# Patient Record
Sex: Male | Born: 1980 | Race: White | Hispanic: No | Marital: Married
Health system: Southern US, Community
[De-identification: ages and names within clinical notes are randomized; demographics above are authoritative.]

---

## 2007-04-29 ENCOUNTER — Encounter: Admission: RE | Admit: 2007-04-29 | Discharge: 2007-04-29 | Payer: Self-pay | Admitting: Internal Medicine

## 2013-09-09 ENCOUNTER — Ambulatory Visit
Admission: RE | Admit: 2013-09-09 | Discharge: 2013-09-09 | Disposition: A | Discharge status: Home / Self Care | Payer: Worker's Compensation | Source: Ambulatory Visit | Attending: Family | Admitting: Family

## 2013-09-09 ENCOUNTER — Other Ambulatory Visit: Payer: Self-pay | Admitting: Family

## 2013-09-09 DIAGNOSIS — S40021A Contusion of right upper arm, initial encounter: Secondary | ICD-10-CM

## 2015-03-16 IMAGING — CR DG ELBOW COMPLETE 3+V*R*
4 series · 4 of 4 positions shown · non-contrast
Comparison: None.

CLINICAL DATA: Trauma.  Posterior pain.

EXAM:
RIGHT ELBOW - COMPLETE 3+ VIEW

[view not recorded (1 of 4)]
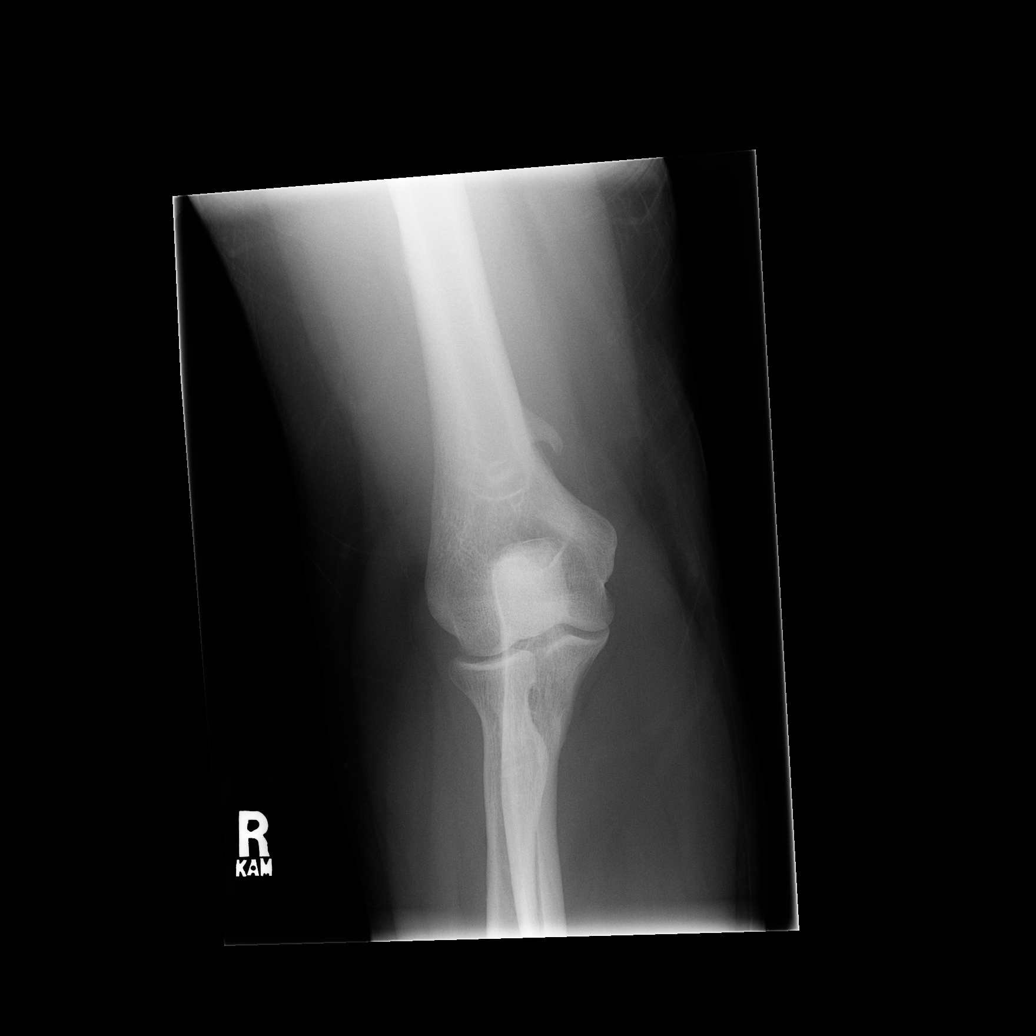

[view not recorded (2 of 4)]
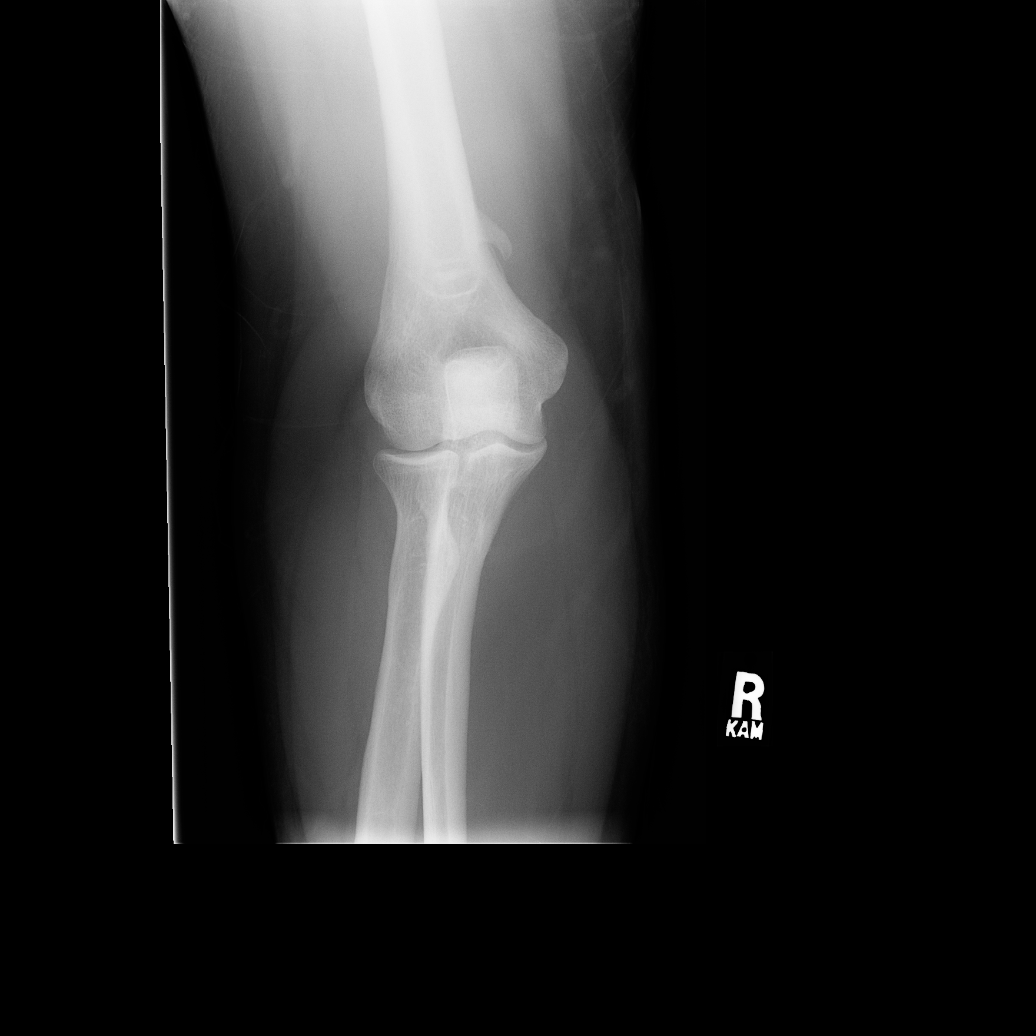

[view not recorded (3 of 4)]
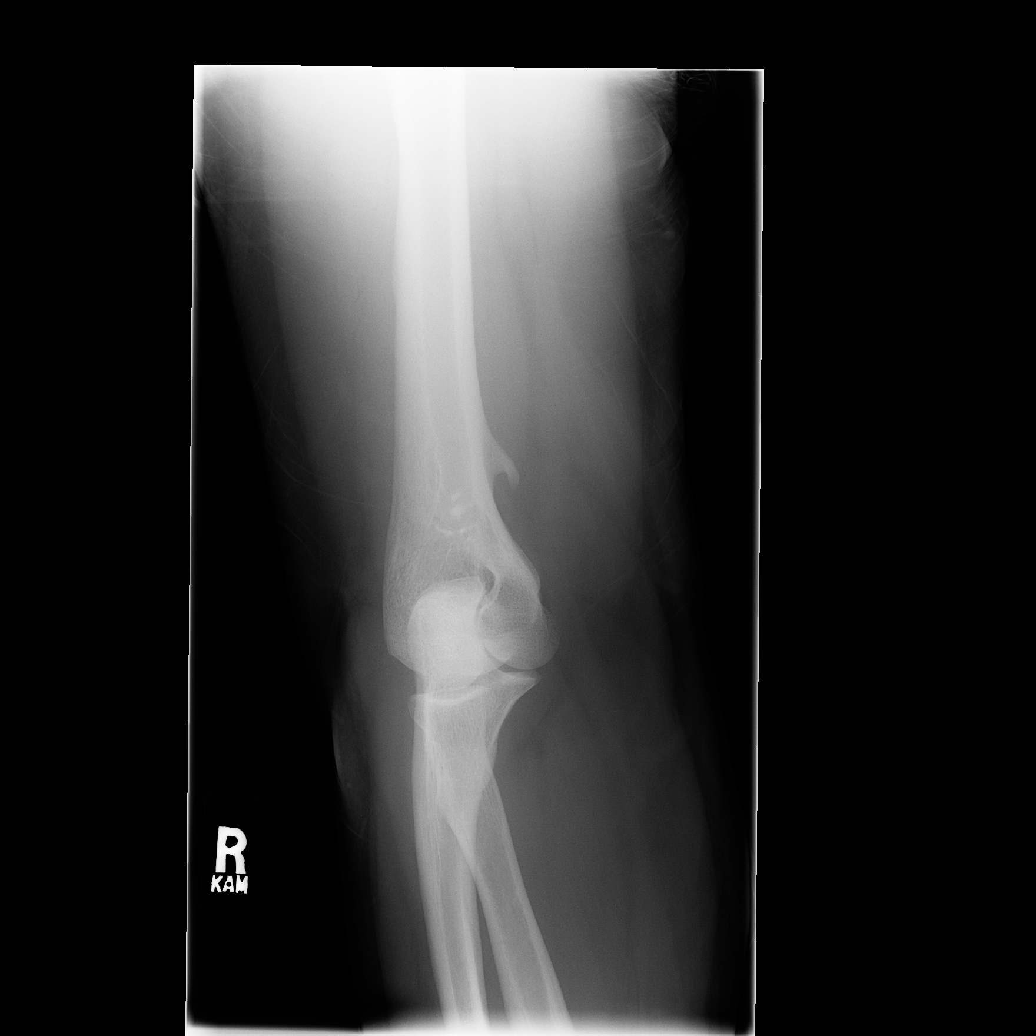

[view not recorded (4 of 4)]
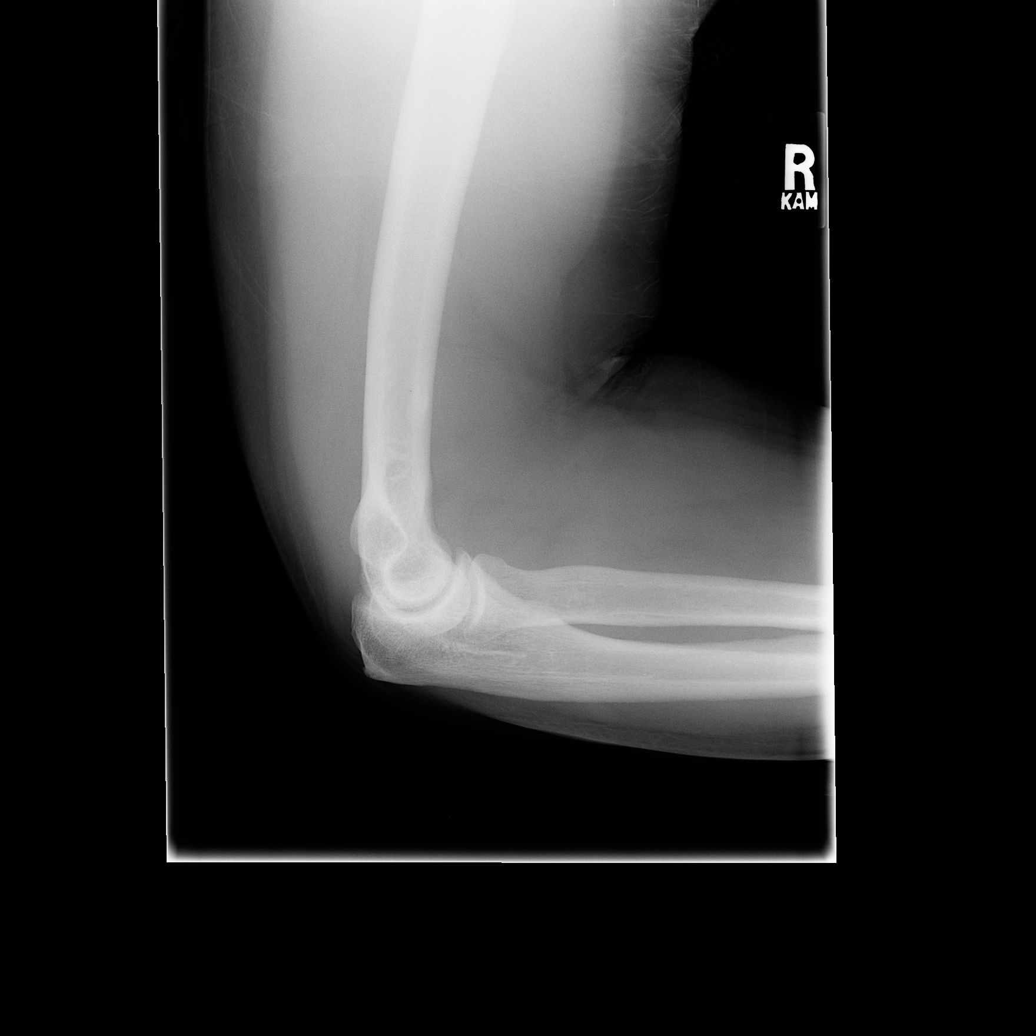

[4 of 4 positions shown; findings below may reference images not displayed]

FINDINGS: No evidence of fracture or joint effusion. Incidentally, the patient
has an osteochondroma projecting from the medial cortex of the
distal humeral diaphyseal metaphyseal junction.
IMPRESSION: No acute or traumatic finding. Distal humeral osteochondroma as
above.
